# Patient Record
Sex: Female | Born: 1955 | Race: White | Hispanic: No
Health system: Southern US, Community
[De-identification: ages and names within clinical notes are randomized; demographics above are authoritative.]

## PROBLEM LIST (undated history)

## (undated) DIAGNOSIS — I959 Hypotension, unspecified: Secondary | ICD-10-CM

## (undated) DIAGNOSIS — J449 Chronic obstructive pulmonary disease, unspecified: Secondary | ICD-10-CM

## (undated) DIAGNOSIS — I5032 Chronic diastolic (congestive) heart failure: Secondary | ICD-10-CM

## (undated) DIAGNOSIS — J9621 Acute and chronic respiratory failure with hypoxia: Secondary | ICD-10-CM

---

## 2018-10-31 ENCOUNTER — Inpatient Hospital Stay
Admission: AD | Admit: 2018-10-31 | Discharge: 2018-11-07 | Disposition: A | Discharge status: Home / Self Care | Payer: Medicare Other | Source: Other Acute Inpatient Hospital | Attending: Internal Medicine | Admitting: Internal Medicine

## 2018-10-31 DIAGNOSIS — J9690 Respiratory failure, unspecified, unspecified whether with hypoxia or hypercapnia: Secondary | ICD-10-CM

## 2018-10-31 DIAGNOSIS — J449 Chronic obstructive pulmonary disease, unspecified: Secondary | ICD-10-CM | POA: Diagnosis present

## 2018-10-31 DIAGNOSIS — I5032 Chronic diastolic (congestive) heart failure: Secondary | ICD-10-CM | POA: Diagnosis present

## 2018-10-31 DIAGNOSIS — J9621 Acute and chronic respiratory failure with hypoxia: Secondary | ICD-10-CM | POA: Diagnosis present

## 2018-10-31 DIAGNOSIS — I959 Hypotension, unspecified: Secondary | ICD-10-CM | POA: Diagnosis present

## 2018-10-31 HISTORY — DX: Acute and chronic respiratory failure with hypoxia: J96.21

## 2018-10-31 HISTORY — DX: Hypotension, unspecified: I95.9

## 2018-10-31 HISTORY — DX: Chronic diastolic (congestive) heart failure: I50.32

## 2018-10-31 HISTORY — DX: Chronic obstructive pulmonary disease, unspecified: J44.9

## 2018-11-01 ENCOUNTER — Other Ambulatory Visit (HOSPITAL_COMMUNITY): Payer: Medicare Other

## 2018-11-01 ENCOUNTER — Encounter: Payer: Self-pay | Admitting: Internal Medicine

## 2018-11-01 DIAGNOSIS — I5032 Chronic diastolic (congestive) heart failure: Secondary | ICD-10-CM

## 2018-11-01 DIAGNOSIS — J449 Chronic obstructive pulmonary disease, unspecified: Secondary | ICD-10-CM

## 2018-11-01 DIAGNOSIS — I9589 Other hypotension: Secondary | ICD-10-CM | POA: Diagnosis not present

## 2018-11-01 DIAGNOSIS — I959 Hypotension, unspecified: Secondary | ICD-10-CM | POA: Diagnosis present

## 2018-11-01 DIAGNOSIS — E861 Hypovolemia: Secondary | ICD-10-CM

## 2018-11-01 DIAGNOSIS — J9621 Acute and chronic respiratory failure with hypoxia: Secondary | ICD-10-CM | POA: Diagnosis not present

## 2018-11-01 LAB — CBC WITH DIFFERENTIAL/PLATELET
Abs Immature Granulocytes: 0.8 10*3/uL — ABNORMAL HIGH (ref 0.00–0.07)
Basophils Absolute: 0.1 10*3/uL (ref 0.0–0.1)
Basophils Relative: 1 %
EOS ABS: 0 10*3/uL (ref 0.0–0.5)
Eosinophils Relative: 0 %
HCT: 42.9 % (ref 36.0–46.0)
Hemoglobin: 14.4 g/dL (ref 12.0–15.0)
Immature Granulocytes: 5 %
Lymphocytes Relative: 23 %
Lymphs Abs: 3.5 10*3/uL (ref 0.7–4.0)
MCH: 29.4 pg (ref 26.0–34.0)
MCHC: 33.6 g/dL (ref 30.0–36.0)
MCV: 87.6 fL (ref 80.0–100.0)
MONO ABS: 1.3 10*3/uL — AB (ref 0.1–1.0)
Monocytes Relative: 8 %
Neutro Abs: 9.4 10*3/uL — ABNORMAL HIGH (ref 1.7–7.7)
Neutrophils Relative %: 63 %
Platelets: 324 10*3/uL (ref 150–400)
RBC: 4.9 MIL/uL (ref 3.87–5.11)
RDW: 13.6 % (ref 11.5–15.5)
WBC: 15.1 10*3/uL — ABNORMAL HIGH (ref 4.0–10.5)
nRBC: 0 % (ref 0.0–0.2)

## 2018-11-01 LAB — COMPREHENSIVE METABOLIC PANEL
ALT: 30 U/L (ref 0–44)
AST: 16 U/L (ref 15–41)
Albumin: 3.1 g/dL — ABNORMAL LOW (ref 3.5–5.0)
Alkaline Phosphatase: 49 U/L (ref 38–126)
Anion gap: 14 (ref 5–15)
BUN: 32 mg/dL — ABNORMAL HIGH (ref 8–23)
CO2: 23 mmol/L (ref 22–32)
Calcium: 9 mg/dL (ref 8.9–10.3)
Chloride: 98 mmol/L (ref 98–111)
Creatinine, Ser: 0.86 mg/dL (ref 0.44–1.00)
GFR calc Af Amer: >60 mL/min (ref >60)
Glucose, Bld: 89 mg/dL (ref 70–99)
Potassium: 4.2 mmol/L (ref 3.5–5.1)
Sodium: 135 mmol/L (ref 135–145)
Total Bilirubin: 0.8 mg/dL (ref 0.3–1.2)
Total Protein: 6.5 g/dL (ref 6.5–8.1)

## 2018-11-01 LAB — PROTIME-INR
INR: 1
Prothrombin Time: 13.1 seconds (ref 11.4–15.2)

## 2018-11-01 NOTE — Consult Note (Signed)
[facility] Critical [facility] [place]  [facility]  Date of Service: 11/01/2018  PULMONARY CRITICAL CARE CONSULT   Kayla Koshyllis A Musquiz  ZOX:[id number]  DOB: 10/20/1955   DOA: 10/31/2018  Referring Physician: Carron CurieAli Hijazi, MD  HPI: Kayla Simpson is a 63 y.o. female seen for follow up of Acute on Chronic Respiratory Failure.  Patient with multiple medical problems including COPD with exacerbation pneumonia aspiration chronic pain GERD anxiety hypokalemia smoker hyperlipidemia degenerative disc disease abnormal glucose tolerance patient presented also with a history of coronary artery disease chronic hypertension peripheral artery disease CHF.  Patient presented to the hospital with increasing shortness of breath cough and congestion.  She was found to have a community-acquired pneumonia and was felt to be septic at the time of presentation.  Patient was admitted for antibiotics and to be started on oxygen was noted to get worsening of the shortness of breath x-ray was thought to be diastolic heart failure was given diuretics.  Echocardiogram was done which was unchanged when compared to 2018.  The respiratory failure was then felt to be secondary to COPD with exacerbation.  CT angiogram was also done which was unremarkable for pulmonary embolism however there was a postobstructive pneumonia noted at that time.  Antibiotics were changed she was started on vancomycin and Zosyn.  Pulmonary did see the patient was started on pulmonary toilet to help mobilize secretions for presumed mucous plugging.  She was transferred to our facility for further management  Review of Systems:  ROS performed and is unremarkable other than noted above.  Past medical history: COPD Pneumonia Respiratory failure Diastolic heart failure Sepsis GERD Chronic pain Hypertension Shock Urinary retention DJD  Past surgical history: Back surgery Breast biopsy Breast surgery Hand  surgery Mandible fracture surgery Oophorectomy Carpal tunnel release  Social history positive for tobacco use no alcohol or drug abuse  Family History: Non-Contributory to the present illness  Allergies not on file  Medications: Reviewed on Rounds  Physical Exam:  Vitals: Temperature 97.8 pulse 70 respiratory 22 blood pressure 97/58 saturations 94%  Ventilator Settings currently is off the ventilator patient is on high flow nasal cannula  . General: Comfortable at this time . Eyes: Grossly normal lids, irises & conjunctiva . ENT: grossly tongue is normal . Neck: no obvious mass . Cardiovascular: S1-S2 normal no gallop or rub . Respiratory: Coarse rhonchi noted bilaterally . Abdomen: Soft and nontender . Skin: no rash seen on limited exam . Musculoskeletal: not rigid . Psychiatric:unable to assess . Neurologic: no seizure no involuntary movements         Labs on Admission:  Basic Metabolic Panel: Recent Labs  Lab 11/01/18 0805  NA 135  K 4.2  CL 98  CO2 23  GLUCOSE 89  BUN 32*  CREATININE 0.86  CALCIUM 9.0    No results for input(s): PHART, PCO2ART, PO2ART, HCO3, O2SAT in the last 168 hours.  Liver Function Tests: Recent Labs  Lab 11/01/18 0805  AST 16  ALT 30  ALKPHOS 49  BILITOT 0.8  PROT 6.5  ALBUMIN 3.1*   No results for input(s): LIPASE, AMYLASE in the last 168 hours. No results for input(s): AMMONIA in the last 168 hours.  CBC: Recent Labs  Lab 11/01/18 0805  WBC 15.1*  NEUTROABS 9.4*  HGB 14.4  HCT 42.9  MCV 87.6  PLT 324    Cardiac Enzymes: No results for input(s): CKTOTAL, CKMB, CKMBINDEX, TROPONINI in the last 168 hours.  BNP (last 3  results) No results for input(s): BNP in the last 8760 hours.  ProBNP (last 3 results) No results for input(s): PROBNP in the last 8760 hours.   Radiological Exams on Admission: Dg Chest Port 1 View  Result Date: 11/01/2018 CLINICAL DATA:  Respiratory failure EXAM: PORTABLE CHEST 1 VIEW  COMPARISON:  None. FINDINGS: Cardiac shadow is prominent but accentuated by the portable technique. Aortic calcifications are seen. Bibasilar infiltrates and effusions are seen. No pneumothorax is noted. No bony abnormality is noted. IMPRESSION: Bilateral effusions and underlying infiltrates. Electronically Signed   By: Alcide Clever M.D.   On: 11/01/2018 08:16    Assessment/Plan Active Problems:   Acute on chronic respiratory failure with hypoxia (HCC)   Chronic diastolic heart failure (HCC)   COPD, severe (HCC)   Hypotension arterial   1. Acute on chronic respiratory failure with hypoxia felt to be secondary to diastolic heart failure pneumonia and foreign body which was removed by bronchoscopy on January 15.  Patient has been intubated now is extubated remains on high flow nasal cannula however.  Will titrate oxygen down as possible 2. Lobar pneumonia patient with treated current chest x-ray shows bilateral effusions with some underlying infiltrate need to continue with present management and monitor closely. 3. COPD with exacerbation she is improving we will monitor and continue with nebulizers. 4. Hypotension now clinically doing better we will continue to monitor 5. Chronic diastolic heart failure we will continue with supportive care monitor fluid status  I have personally seen and evaluated the patient, evaluated laboratory and imaging results, formulated the assessment and plan and placed orders. The Patient requires high complexity decision making for assessment and support.  Case was discussed on Rounds with the Respiratory Therapy Staff Time Spent  Yevonne Pax, MD [facility] Pulmonary Critical Care Medicine Sleep Medicine

## 2018-11-02 DIAGNOSIS — J9621 Acute and chronic respiratory failure with hypoxia: Secondary | ICD-10-CM | POA: Diagnosis not present

## 2018-11-02 DIAGNOSIS — I9589 Other hypotension: Secondary | ICD-10-CM | POA: Diagnosis not present

## 2018-11-02 DIAGNOSIS — I5032 Chronic diastolic (congestive) heart failure: Secondary | ICD-10-CM | POA: Diagnosis not present

## 2018-11-02 DIAGNOSIS — J449 Chronic obstructive pulmonary disease, unspecified: Secondary | ICD-10-CM | POA: Diagnosis not present

## 2018-11-02 MED ORDER — SODIUM CHLORIDE 3 % IN NEBU
4.00 | INHALATION_SOLUTION | RESPIRATORY_TRACT | Status: DC
Start: 2018-10-31 — End: 2018-11-02

## 2018-11-02 MED ORDER — BIOTENE DRY MOUTH MOISTURIZING MT SOLN
OROMUCOSAL | Status: DC
Start: ? — End: 2018-11-02

## 2018-11-02 MED ORDER — GLUCOSE 40 % PO GEL
ORAL | Status: DC
Start: ? — End: 2018-11-02

## 2018-11-02 MED ORDER — OMEGA 3 1000 MG PO CAPS
1.00 | ORAL_CAPSULE | ORAL | Status: DC
Start: 2018-10-31 — End: 2018-11-02

## 2018-11-02 MED ORDER — IPRATROPIUM-ALBUTEROL 0.5-2.5 (3) MG/3ML IN SOLN
3.00 | RESPIRATORY_TRACT | Status: DC
Start: 2018-10-31 — End: 2018-11-02

## 2018-11-02 MED ORDER — HYDROCOD POLST-CPM POLST ER 10-8 MG/5ML PO SUER
5.00 | ORAL | Status: DC
Start: ? — End: 2018-11-02

## 2018-11-02 MED ORDER — INSULIN LISPRO 100 UNIT/ML ~~LOC~~ SOLN
0.00 | SUBCUTANEOUS | Status: DC
Start: 2018-10-31 — End: 2018-11-02

## 2018-11-02 MED ORDER — QUETIAPINE FUMARATE 25 MG PO TABS
25.00 | ORAL_TABLET | ORAL | Status: DC
Start: 2018-10-31 — End: 2018-11-02

## 2018-11-02 MED ORDER — NITROGLYCERIN 0.4 MG SL SUBL
.40 | SUBLINGUAL_TABLET | SUBLINGUAL | Status: DC
Start: ? — End: 2018-11-02

## 2018-11-02 MED ORDER — FLUTICASONE PROPIONATE 50 MCG/ACT NA SUSP
2.00 | NASAL | Status: DC
Start: 2018-10-31 — End: 2018-11-02

## 2018-11-02 MED ORDER — DEXTROSE 50 % IV SOLN
50.00 | INTRAVENOUS | Status: DC
Start: ? — End: 2018-11-02

## 2018-11-02 MED ORDER — NICOTINE 21 MG/24HR TD PT24
1.00 | MEDICATED_PATCH | TRANSDERMAL | Status: DC
Start: 2018-11-01 — End: 2018-11-02

## 2018-11-02 MED ORDER — METHYLPREDNISOLONE SODIUM SUCC 40 MG IJ SOLR
40.00 | INTRAMUSCULAR | Status: DC
Start: 2018-11-01 — End: 2018-11-02

## 2018-11-02 MED ORDER — ALBUTEROL SULFATE (2.5 MG/3ML) 0.083% IN NEBU
2.50 | INHALATION_SOLUTION | RESPIRATORY_TRACT | Status: DC
Start: ? — End: 2018-11-02

## 2018-11-02 MED ORDER — GENERIC EXTERNAL MEDICATION
500.00 | Status: DC
Start: 2018-10-31 — End: 2018-11-02

## 2018-11-02 MED ORDER — ACETAMINOPHEN 325 MG PO TABS
650.00 | ORAL_TABLET | ORAL | Status: DC
Start: ? — End: 2018-11-02

## 2018-11-02 MED ORDER — LORAZEPAM 0.5 MG PO TABS
.50 | ORAL_TABLET | ORAL | Status: DC
Start: ? — End: 2018-11-02

## 2018-11-02 MED ORDER — BISACODYL 5 MG PO TBEC
10.00 | DELAYED_RELEASE_TABLET | ORAL | Status: DC
Start: ? — End: 2018-11-02

## 2018-11-02 MED ORDER — POLYETHYLENE GLYCOL 3350 17 G PO PACK
17.00 | PACK | ORAL | Status: DC
Start: 2018-11-01 — End: 2018-11-02

## 2018-11-02 MED ORDER — PRAVASTATIN SODIUM 20 MG PO TABS
20.00 | ORAL_TABLET | ORAL | Status: DC
Start: 2018-11-01 — End: 2018-11-02

## 2018-11-02 MED ORDER — FUROSEMIDE 10 MG/ML IJ SOLN
40.00 | INTRAMUSCULAR | Status: DC
Start: 2018-10-31 — End: 2018-11-02

## 2018-11-02 MED ORDER — POTASSIUM CHLORIDE ER 10 MEQ PO TBCR
20.00 | EXTENDED_RELEASE_TABLET | ORAL | Status: DC
Start: 2018-11-01 — End: 2018-11-02

## 2018-11-02 MED ORDER — ENOXAPARIN SODIUM 40 MG/0.4ML ~~LOC~~ SOLN
40.00 | SUBCUTANEOUS | Status: DC
Start: 2018-11-01 — End: 2018-11-02

## 2018-11-02 MED ORDER — SPIRONOLACTONE 25 MG PO TABS
25.00 | ORAL_TABLET | ORAL | Status: DC
Start: 2018-10-31 — End: 2018-11-02

## 2018-11-02 MED ORDER — GLUCAGON HCL (DIAGNOSTIC) 1 MG IJ SOLR
1.00 | INTRAMUSCULAR | Status: DC
Start: ? — End: 2018-11-02

## 2018-11-02 MED ORDER — RISA-BID PROBIOTIC PO TABS
2.00 | ORAL_TABLET | ORAL | Status: DC
Start: 2018-10-31 — End: 2018-11-02

## 2018-11-02 NOTE — Progress Notes (Signed)
Pulmonary Critical Care Medicine [facility] [place]   PULMONARY CRITICAL CARE SERVICE  PROGRESS NOTE  Date of Service: 11/02/2018  Kayla Simpson  [id number]  DOB: Feb 08, 1956   DOA: 10/31/2018  Referring Physician: Carron Curie, MD  HPI: Kayla Simpson is a 63 y.o. female seen for follow up of Acute on Chronic Respiratory Failure.  Right now patient is on 5 L nasal cannula is doing fairly well.  Gradually being weaned down on oxygen  Medications: Reviewed on Rounds  Physical Exam:  Vitals: Temperature 97.6 pulse 90 respiratory rate 20 blood pressure 128/71 saturations 95%  Ventilator Settings off the ventilator on 5 L  . General: Comfortable at this time . Eyes: Grossly normal lids, irises & conjunctiva . ENT: grossly tongue is normal . Neck: no obvious mass . Cardiovascular: S1 S2 normal no gallop . Respiratory: No rhonchi or rales are noted at this time . Abdomen: soft . Skin: no rash seen on limited exam . Musculoskeletal: not rigid . Psychiatric:unable to assess . Neurologic: no seizure no involuntary movements         Lab Data:   Basic Metabolic Panel: Recent Labs  Lab 11/01/18 0805  NA 135  K 4.2  CL 98  CO2 23  GLUCOSE 89  BUN 32*  CREATININE 0.86  CALCIUM 9.0    ABG: No results for input(s): PHART, PCO2ART, PO2ART, HCO3, O2SAT in the last 168 hours.  Liver Function Tests: Recent Labs  Lab 11/01/18 0805  AST 16  ALT 30  ALKPHOS 49  BILITOT 0.8  PROT 6.5  ALBUMIN 3.1*   No results for input(s): LIPASE, AMYLASE in the last 168 hours. No results for input(s): AMMONIA in the last 168 hours.  CBC: Recent Labs  Lab 11/01/18 0805  WBC 15.1*  NEUTROABS 9.4*  HGB 14.4  HCT 42.9  MCV 87.6  PLT 324    Cardiac Enzymes: No results for input(s): CKTOTAL, CKMB, CKMBINDEX, TROPONINI in the last 168 hours.  BNP (last 3 results) No results for input(s): BNP in the last 8760 hours.  ProBNP (last 3 results) No results for  input(s): PROBNP in the last 8760 hours.  Radiological Exams: Dg Chest Port 1 View  Result Date: 11/01/2018 CLINICAL DATA:  Respiratory failure EXAM: PORTABLE CHEST 1 VIEW COMPARISON:  None. FINDINGS: Cardiac shadow is prominent but accentuated by the portable technique. Aortic calcifications are seen. Bibasilar infiltrates and effusions are seen. No pneumothorax is noted. No bony abnormality is noted. IMPRESSION: Bilateral effusions and underlying infiltrates. Electronically Signed   By: Alcide Clever M.D.   On: 11/01/2018 08:16    Assessment/Plan Active Problems:   Acute on chronic respiratory failure with hypoxia (HCC)   Chronic diastolic heart failure (HCC)   COPD, severe (HCC)   Hypotension arterial   1. Acute on chronic respiratory failure with hypoxia patient currently is on weaning on oxygen will continue to do so titrate as tolerated 2. Chronic diastolic heart failure compensated 3. Severe COPD at baseline 4. Hypotension resolved   I have personally seen and evaluated the patient, evaluated laboratory and imaging results, formulated the assessment and plan and placed orders. The Patient requires high complexity decision making for assessment and support.  Case was discussed on Rounds with the Respiratory Therapy Staff  Yevonne Pax, MD [facility] Pulmonary Critical Care Medicine Sleep Medicine

## 2018-11-03 LAB — CULTURE, RESPIRATORY W GRAM STAIN: Culture: NORMAL

## 2018-11-04 LAB — CBC
HEMATOCRIT: 41.4 % (ref 36.0–46.0)
Hemoglobin: 14.6 g/dL (ref 12.0–15.0)
MCH: 30 pg (ref 26.0–34.0)
MCHC: 35.3 g/dL (ref 30.0–36.0)
MCV: 85.2 fL (ref 80.0–100.0)
Platelets: 320 10*3/uL (ref 150–400)
RBC: 4.86 MIL/uL (ref 3.87–5.11)
RDW: 13.4 % (ref 11.5–15.5)
WBC: 16.2 10*3/uL — ABNORMAL HIGH (ref 4.0–10.5)
nRBC: 0 % (ref 0.0–0.2)

## 2018-11-04 LAB — BASIC METABOLIC PANEL WITH GFR
Anion gap: 12 (ref 5–15)
BUN: 28 mg/dL — ABNORMAL HIGH (ref 8–23)
CO2: 25 mmol/L (ref 22–32)
Calcium: 9 mg/dL (ref 8.9–10.3)
Chloride: 98 mmol/L (ref 98–111)
Creatinine, Ser: 0.97 mg/dL (ref 0.44–1.00)
GFR calc Af Amer: >60 mL/min
GFR calc non Af Amer: >60 mL/min
Glucose, Bld: 124 mg/dL — ABNORMAL HIGH (ref 70–99)
Potassium: 4.2 mmol/L (ref 3.5–5.1)
Sodium: 135 mmol/L (ref 135–145)

## 2018-11-06 ENCOUNTER — Encounter: Admission: AD | Disposition: A | Discharge status: Home / Self Care | Payer: Self-pay | Attending: Internal Medicine

## 2018-11-06 SURGERY — LAPAROSCOPIC CHOLECYSTECTOMY SINGLE SITE WITH INTRAOPERATIVE CHOLANGIOGRAM
Anesthesia: General

## 2019-11-28 IMAGING — DX DG CHEST 1V PORT
1 series · 1 of 1 positions shown · non-contrast
Comparison: None.

CLINICAL DATA: Respiratory failure

EXAM:
PORTABLE CHEST 1 VIEW

[chest ap]
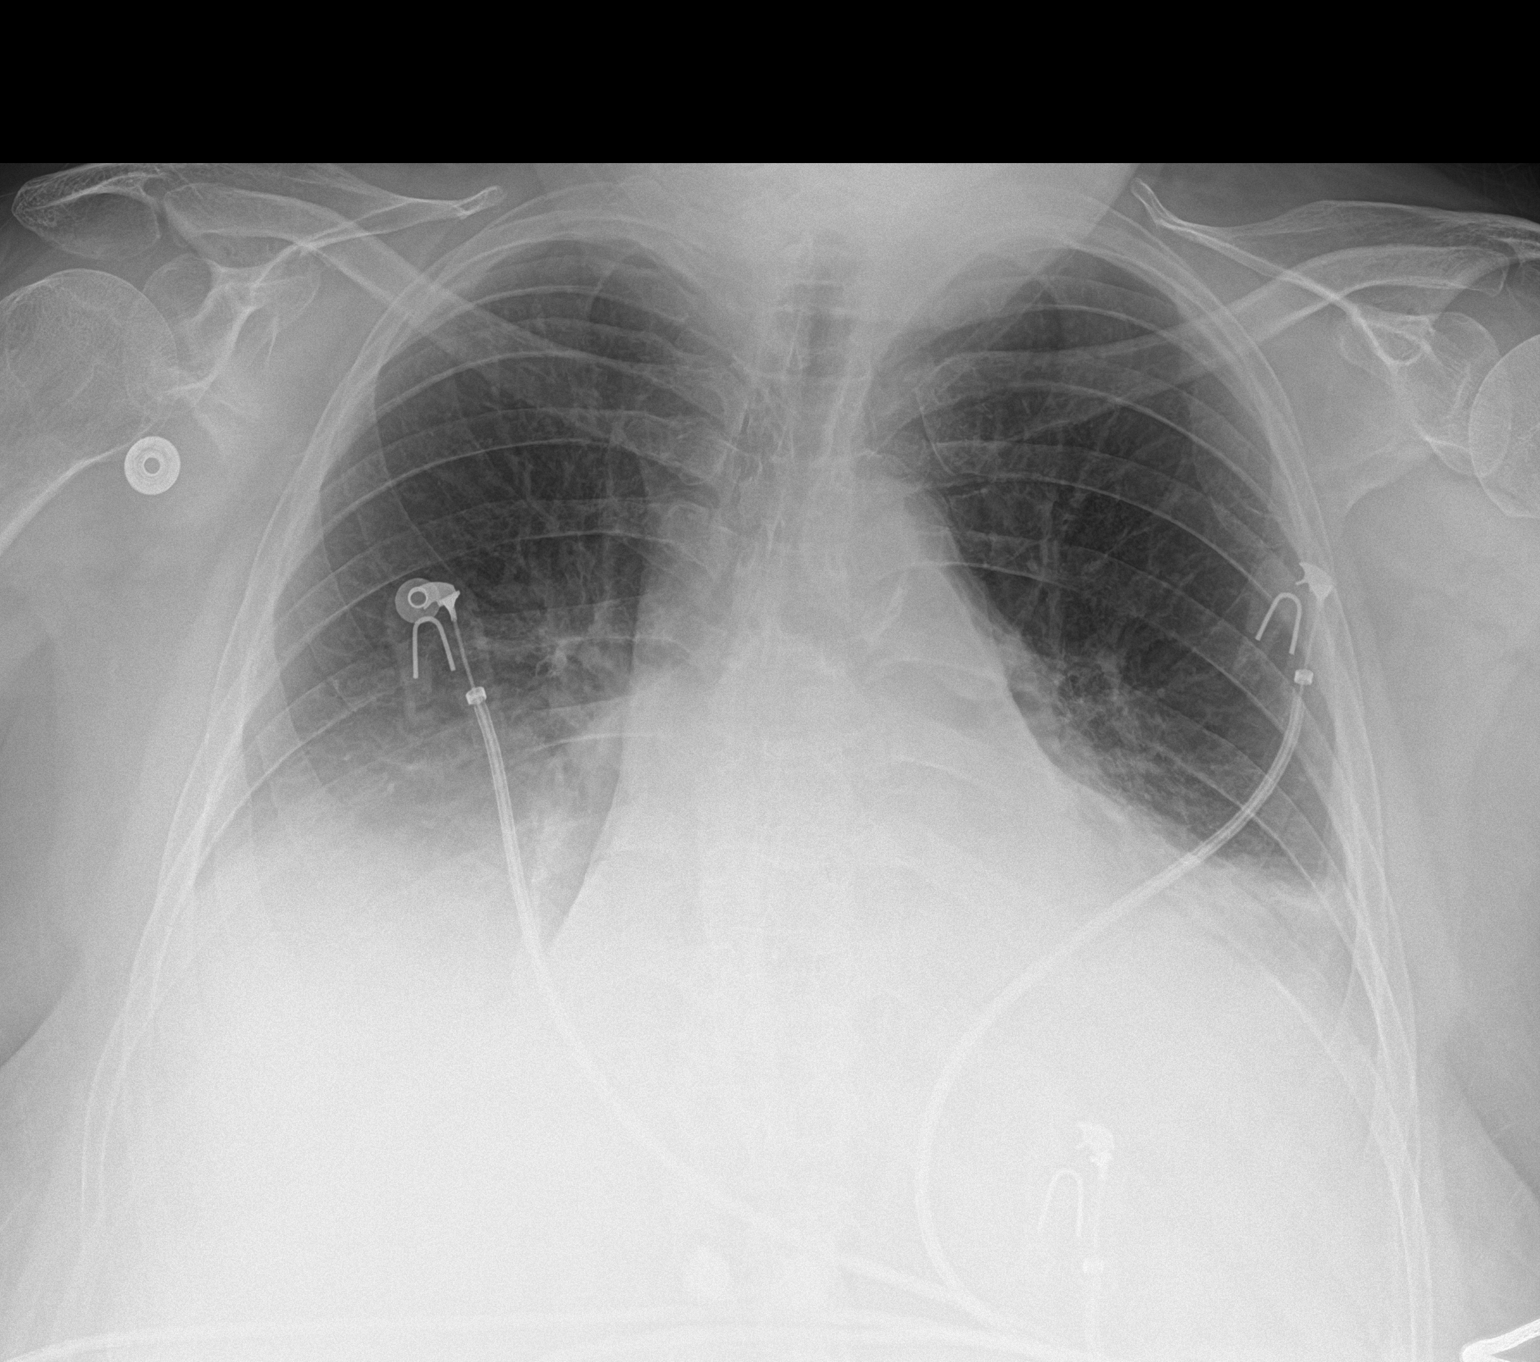

[1 of 1 positions shown; findings below may reference images not displayed]

FINDINGS: Cardiac shadow is prominent but accentuated by the portable
technique. Aortic calcifications are seen. Bibasilar infiltrates and
effusions are seen. No pneumothorax is noted. No bony abnormality is
noted.
IMPRESSION: Bilateral effusions and underlying infiltrates.
